# Patient Record
Sex: Female | Born: 1945 | Race: White | Hispanic: No | Marital: Single
Health system: Southern US, Community
[De-identification: ages and names within clinical notes are randomized; demographics above are authoritative.]

---

## 1998-05-06 ENCOUNTER — Other Ambulatory Visit: Admission: RE | Admit: 1998-05-06 | Discharge: 1998-05-06 | Payer: Self-pay | Admitting: Gynecology

## 1999-11-17 ENCOUNTER — Encounter: Payer: Self-pay | Admitting: Family Medicine

## 1999-11-17 ENCOUNTER — Encounter: Admission: RE | Admit: 1999-11-17 | Discharge: 1999-11-17 | Payer: Self-pay | Admitting: Family Medicine

## 2000-11-17 ENCOUNTER — Encounter: Admission: RE | Admit: 2000-11-17 | Discharge: 2000-11-17 | Payer: Self-pay | Admitting: Family Medicine

## 2000-11-17 ENCOUNTER — Encounter: Payer: Self-pay | Admitting: Family Medicine

## 2001-11-20 ENCOUNTER — Encounter: Payer: Self-pay | Admitting: Family Medicine

## 2001-11-20 ENCOUNTER — Encounter: Admission: RE | Admit: 2001-11-20 | Discharge: 2001-11-20 | Payer: Self-pay | Admitting: Family Medicine

## 2002-11-21 ENCOUNTER — Encounter: Admission: RE | Admit: 2002-11-21 | Discharge: 2002-11-21 | Payer: Self-pay | Admitting: Family Medicine

## 2002-11-21 ENCOUNTER — Encounter: Payer: Self-pay | Admitting: Family Medicine

## 2003-11-29 ENCOUNTER — Encounter: Admission: RE | Admit: 2003-11-29 | Discharge: 2003-11-29 | Payer: Self-pay | Admitting: Family Medicine

## 2004-09-24 ENCOUNTER — Other Ambulatory Visit: Admission: RE | Admit: 2004-09-24 | Discharge: 2004-09-24 | Payer: Self-pay | Admitting: Family Medicine

## 2004-12-07 ENCOUNTER — Encounter: Admission: RE | Admit: 2004-12-07 | Discharge: 2004-12-07 | Payer: Self-pay | Admitting: Family Medicine

## 2005-10-06 ENCOUNTER — Other Ambulatory Visit: Admission: RE | Admit: 2005-10-06 | Discharge: 2005-10-06 | Payer: Self-pay | Admitting: *Deleted

## 2005-12-22 ENCOUNTER — Encounter: Admission: RE | Admit: 2005-12-22 | Discharge: 2005-12-22 | Payer: Self-pay | Admitting: *Deleted

## 2006-01-07 ENCOUNTER — Encounter: Admission: RE | Admit: 2006-01-07 | Discharge: 2006-01-07 | Payer: Self-pay | Admitting: *Deleted

## 2006-10-12 ENCOUNTER — Other Ambulatory Visit: Admission: RE | Admit: 2006-10-12 | Discharge: 2006-10-12 | Payer: Self-pay | Admitting: *Deleted

## 2006-12-26 ENCOUNTER — Encounter: Admission: RE | Admit: 2006-12-26 | Discharge: 2006-12-26 | Payer: Self-pay | Admitting: *Deleted

## 2007-12-28 ENCOUNTER — Encounter: Admission: RE | Admit: 2007-12-28 | Discharge: 2007-12-28 | Payer: Self-pay | Admitting: Family Medicine

## 2008-09-25 ENCOUNTER — Other Ambulatory Visit: Admission: RE | Admit: 2008-09-25 | Discharge: 2008-09-25 | Payer: Self-pay | Admitting: Family Medicine

## 2008-12-30 ENCOUNTER — Encounter: Admission: RE | Admit: 2008-12-30 | Discharge: 2008-12-30 | Payer: Self-pay | Admitting: Family Medicine

## 2010-01-09 ENCOUNTER — Encounter: Admission: RE | Admit: 2010-01-09 | Discharge: 2010-01-09 | Payer: Self-pay | Admitting: Family Medicine

## 2010-11-02 ENCOUNTER — Other Ambulatory Visit: Admission: RE | Admit: 2010-11-02 | Discharge: 2010-11-02 | Payer: Self-pay | Admitting: Family Medicine

## 2011-01-11 ENCOUNTER — Encounter
Admission: RE | Admit: 2011-01-11 | Discharge: 2011-01-11 | Payer: Self-pay | Source: Home / Self Care | Attending: Family Medicine | Admitting: Family Medicine

## 2011-07-12 ENCOUNTER — Other Ambulatory Visit: Payer: Self-pay | Admitting: Physician Assistant

## 2011-07-12 ENCOUNTER — Ambulatory Visit
Admission: RE | Admit: 2011-07-12 | Discharge: 2011-07-12 | Disposition: A | Discharge status: Home / Self Care | Payer: BC Managed Care – PPO | Source: Ambulatory Visit | Attending: Physician Assistant | Admitting: Physician Assistant

## 2011-07-12 DIAGNOSIS — M546 Pain in thoracic spine: Secondary | ICD-10-CM

## 2012-01-05 ENCOUNTER — Other Ambulatory Visit: Payer: Self-pay | Admitting: Family Medicine

## 2012-01-05 DIAGNOSIS — Z1231 Encounter for screening mammogram for malignant neoplasm of breast: Secondary | ICD-10-CM

## 2012-01-18 ENCOUNTER — Ambulatory Visit
Admission: RE | Admit: 2012-01-18 | Discharge: 2012-01-18 | Disposition: A | Discharge status: Home / Self Care | Payer: BC Managed Care – PPO | Source: Ambulatory Visit | Attending: Family Medicine | Admitting: Family Medicine

## 2012-01-18 DIAGNOSIS — Z1231 Encounter for screening mammogram for malignant neoplasm of breast: Secondary | ICD-10-CM

## 2012-12-25 ENCOUNTER — Other Ambulatory Visit: Payer: Self-pay | Admitting: Family Medicine

## 2012-12-25 DIAGNOSIS — Z1231 Encounter for screening mammogram for malignant neoplasm of breast: Secondary | ICD-10-CM

## 2013-01-18 ENCOUNTER — Ambulatory Visit
Admission: RE | Admit: 2013-01-18 | Discharge: 2013-01-18 | Disposition: A | Discharge status: Home / Self Care | Payer: BC Managed Care – PPO | Source: Ambulatory Visit | Attending: Family Medicine | Admitting: Family Medicine

## 2013-01-18 DIAGNOSIS — Z1231 Encounter for screening mammogram for malignant neoplasm of breast: Secondary | ICD-10-CM

## 2013-07-06 ENCOUNTER — Other Ambulatory Visit (HOSPITAL_COMMUNITY)
Admission: RE | Admit: 2013-07-06 | Discharge: 2013-07-06 | Disposition: A | Discharge status: Home / Self Care | Payer: BC Managed Care – PPO | Source: Ambulatory Visit | Attending: Family Medicine | Admitting: Family Medicine

## 2013-07-06 ENCOUNTER — Other Ambulatory Visit: Payer: Self-pay | Admitting: Physician Assistant

## 2013-07-06 DIAGNOSIS — Z0000 Encounter for general adult medical examination without abnormal findings: Secondary | ICD-10-CM | POA: Insufficient documentation

## 2013-12-26 ENCOUNTER — Other Ambulatory Visit: Payer: Self-pay

## 2013-12-26 DIAGNOSIS — Z1231 Encounter for screening mammogram for malignant neoplasm of breast: Secondary | ICD-10-CM

## 2014-01-21 ENCOUNTER — Ambulatory Visit: Payer: BC Managed Care – PPO

## 2014-01-29 ENCOUNTER — Ambulatory Visit
Admission: RE | Admit: 2014-01-29 | Discharge: 2014-01-29 | Disposition: A | Discharge status: Home / Self Care | Payer: BC Managed Care – PPO | Source: Ambulatory Visit

## 2014-01-29 DIAGNOSIS — Z1231 Encounter for screening mammogram for malignant neoplasm of breast: Secondary | ICD-10-CM

## 2014-08-29 ENCOUNTER — Other Ambulatory Visit: Payer: Self-pay | Admitting: Dermatology

## 2014-10-07 ENCOUNTER — Other Ambulatory Visit: Payer: Self-pay | Admitting: Dermatology

## 2014-12-31 ENCOUNTER — Other Ambulatory Visit: Payer: Self-pay

## 2014-12-31 DIAGNOSIS — Z1231 Encounter for screening mammogram for malignant neoplasm of breast: Secondary | ICD-10-CM

## 2015-01-30 ENCOUNTER — Ambulatory Visit
Admission: RE | Admit: 2015-01-30 | Discharge: 2015-01-30 | Disposition: A | Discharge status: Home / Self Care | Payer: Medicare Other | Source: Ambulatory Visit

## 2015-01-30 ENCOUNTER — Encounter (INDEPENDENT_AMBULATORY_CARE_PROVIDER_SITE_OTHER): Payer: Self-pay

## 2015-01-30 DIAGNOSIS — Z1231 Encounter for screening mammogram for malignant neoplasm of breast: Secondary | ICD-10-CM

## 2016-01-02 ENCOUNTER — Other Ambulatory Visit: Payer: Self-pay

## 2016-01-02 DIAGNOSIS — Z1231 Encounter for screening mammogram for malignant neoplasm of breast: Secondary | ICD-10-CM

## 2016-02-02 ENCOUNTER — Ambulatory Visit
Admission: RE | Admit: 2016-02-02 | Discharge: 2016-02-02 | Disposition: A | Discharge status: Home / Self Care | Payer: Medicare Other | Source: Ambulatory Visit

## 2016-02-02 DIAGNOSIS — Z1231 Encounter for screening mammogram for malignant neoplasm of breast: Secondary | ICD-10-CM

## 2017-01-11 ENCOUNTER — Other Ambulatory Visit: Payer: Self-pay | Admitting: Family Medicine

## 2017-01-11 DIAGNOSIS — Z1231 Encounter for screening mammogram for malignant neoplasm of breast: Secondary | ICD-10-CM

## 2017-02-02 ENCOUNTER — Ambulatory Visit
Admission: RE | Admit: 2017-02-02 | Discharge: 2017-02-02 | Disposition: A | Discharge status: Home / Self Care | Payer: Self-pay | Source: Ambulatory Visit | Attending: Family Medicine | Admitting: Family Medicine

## 2017-02-02 ENCOUNTER — Encounter: Payer: Self-pay | Admitting: Radiology

## 2017-02-02 DIAGNOSIS — Z1231 Encounter for screening mammogram for malignant neoplasm of breast: Secondary | ICD-10-CM

## 2017-04-18 DIAGNOSIS — F17211 Nicotine dependence, cigarettes, in remission: Secondary | ICD-10-CM | POA: Diagnosis not present

## 2017-04-18 DIAGNOSIS — I10 Essential (primary) hypertension: Secondary | ICD-10-CM | POA: Diagnosis not present

## 2017-04-18 DIAGNOSIS — E782 Mixed hyperlipidemia: Secondary | ICD-10-CM | POA: Diagnosis not present

## 2017-09-21 DIAGNOSIS — T782XXA Anaphylactic shock, unspecified, initial encounter: Secondary | ICD-10-CM | POA: Diagnosis not present

## 2017-10-19 DIAGNOSIS — E782 Mixed hyperlipidemia: Secondary | ICD-10-CM | POA: Diagnosis not present

## 2017-10-19 DIAGNOSIS — G43109 Migraine with aura, not intractable, without status migrainosus: Secondary | ICD-10-CM | POA: Diagnosis not present

## 2017-10-19 DIAGNOSIS — Z1389 Encounter for screening for other disorder: Secondary | ICD-10-CM | POA: Diagnosis not present

## 2017-10-19 DIAGNOSIS — Z1211 Encounter for screening for malignant neoplasm of colon: Secondary | ICD-10-CM | POA: Diagnosis not present

## 2017-10-19 DIAGNOSIS — Z6835 Body mass index (BMI) 35.0-35.9, adult: Secondary | ICD-10-CM | POA: Diagnosis not present

## 2017-10-19 DIAGNOSIS — Z0000 Encounter for general adult medical examination without abnormal findings: Secondary | ICD-10-CM | POA: Diagnosis not present

## 2017-10-19 DIAGNOSIS — I10 Essential (primary) hypertension: Secondary | ICD-10-CM | POA: Diagnosis not present

## 2017-11-21 DIAGNOSIS — Z8371 Family history of colonic polyps: Secondary | ICD-10-CM | POA: Diagnosis not present

## 2017-11-21 DIAGNOSIS — K635 Polyp of colon: Secondary | ICD-10-CM | POA: Diagnosis not present

## 2017-11-21 DIAGNOSIS — Z1211 Encounter for screening for malignant neoplasm of colon: Secondary | ICD-10-CM | POA: Diagnosis not present

## 2017-11-21 DIAGNOSIS — K621 Rectal polyp: Secondary | ICD-10-CM | POA: Diagnosis not present

## 2017-11-24 DIAGNOSIS — K635 Polyp of colon: Secondary | ICD-10-CM | POA: Diagnosis not present

## 2018-01-23 ENCOUNTER — Other Ambulatory Visit: Payer: Self-pay | Admitting: Family Medicine

## 2018-01-23 DIAGNOSIS — Z1231 Encounter for screening mammogram for malignant neoplasm of breast: Secondary | ICD-10-CM

## 2018-02-08 ENCOUNTER — Ambulatory Visit: Payer: PPO

## 2018-02-24 ENCOUNTER — Ambulatory Visit
Admission: RE | Admit: 2018-02-24 | Discharge: 2018-02-24 | Disposition: A | Discharge status: Home / Self Care | Payer: PPO | Source: Ambulatory Visit | Attending: Family Medicine | Admitting: Family Medicine

## 2018-02-24 DIAGNOSIS — Z1231 Encounter for screening mammogram for malignant neoplasm of breast: Secondary | ICD-10-CM | POA: Diagnosis not present

## 2018-03-06 DIAGNOSIS — H524 Presbyopia: Secondary | ICD-10-CM | POA: Diagnosis not present

## 2018-03-06 DIAGNOSIS — H52223 Regular astigmatism, bilateral: Secondary | ICD-10-CM | POA: Diagnosis not present

## 2018-03-06 DIAGNOSIS — H43812 Vitreous degeneration, left eye: Secondary | ICD-10-CM | POA: Diagnosis not present

## 2018-03-06 DIAGNOSIS — H2513 Age-related nuclear cataract, bilateral: Secondary | ICD-10-CM | POA: Diagnosis not present

## 2018-04-17 DIAGNOSIS — E782 Mixed hyperlipidemia: Secondary | ICD-10-CM | POA: Diagnosis not present

## 2018-04-17 DIAGNOSIS — Z6833 Body mass index (BMI) 33.0-33.9, adult: Secondary | ICD-10-CM | POA: Diagnosis not present

## 2018-04-17 DIAGNOSIS — I10 Essential (primary) hypertension: Secondary | ICD-10-CM | POA: Diagnosis not present

## 2018-04-17 DIAGNOSIS — G43109 Migraine with aura, not intractable, without status migrainosus: Secondary | ICD-10-CM | POA: Diagnosis not present

## 2018-06-19 DIAGNOSIS — R944 Abnormal results of kidney function studies: Secondary | ICD-10-CM | POA: Diagnosis not present

## 2018-10-25 DIAGNOSIS — E669 Obesity, unspecified: Secondary | ICD-10-CM | POA: Diagnosis not present

## 2018-10-25 DIAGNOSIS — G43109 Migraine with aura, not intractable, without status migrainosus: Secondary | ICD-10-CM | POA: Diagnosis not present

## 2018-10-25 DIAGNOSIS — E782 Mixed hyperlipidemia: Secondary | ICD-10-CM | POA: Diagnosis not present

## 2018-10-25 DIAGNOSIS — L989 Disorder of the skin and subcutaneous tissue, unspecified: Secondary | ICD-10-CM | POA: Diagnosis not present

## 2018-10-25 DIAGNOSIS — Z6833 Body mass index (BMI) 33.0-33.9, adult: Secondary | ICD-10-CM | POA: Diagnosis not present

## 2018-10-25 DIAGNOSIS — Z0000 Encounter for general adult medical examination without abnormal findings: Secondary | ICD-10-CM | POA: Diagnosis not present

## 2018-10-25 DIAGNOSIS — I10 Essential (primary) hypertension: Secondary | ICD-10-CM | POA: Diagnosis not present

## 2018-10-25 DIAGNOSIS — Z1389 Encounter for screening for other disorder: Secondary | ICD-10-CM | POA: Diagnosis not present

## 2018-10-25 DIAGNOSIS — M8580 Other specified disorders of bone density and structure, unspecified site: Secondary | ICD-10-CM | POA: Diagnosis not present

## 2018-11-24 DIAGNOSIS — D582 Other hemoglobinopathies: Secondary | ICD-10-CM | POA: Diagnosis not present

## 2019-01-16 ENCOUNTER — Other Ambulatory Visit: Payer: Self-pay | Admitting: Family Medicine

## 2019-01-16 DIAGNOSIS — Z1231 Encounter for screening mammogram for malignant neoplasm of breast: Secondary | ICD-10-CM

## 2019-02-27 ENCOUNTER — Ambulatory Visit
Admission: RE | Admit: 2019-02-27 | Discharge: 2019-02-27 | Disposition: A | Discharge status: Home / Self Care | Payer: PPO | Source: Ambulatory Visit | Attending: Family Medicine | Admitting: Family Medicine

## 2019-02-27 DIAGNOSIS — Z1231 Encounter for screening mammogram for malignant neoplasm of breast: Secondary | ICD-10-CM | POA: Diagnosis not present

## 2019-04-25 DIAGNOSIS — I10 Essential (primary) hypertension: Secondary | ICD-10-CM | POA: Diagnosis not present

## 2019-04-25 DIAGNOSIS — E782 Mixed hyperlipidemia: Secondary | ICD-10-CM | POA: Diagnosis not present

## 2019-11-05 DIAGNOSIS — E782 Mixed hyperlipidemia: Secondary | ICD-10-CM | POA: Diagnosis not present

## 2019-11-05 DIAGNOSIS — Z1389 Encounter for screening for other disorder: Secondary | ICD-10-CM | POA: Diagnosis not present

## 2019-11-05 DIAGNOSIS — I10 Essential (primary) hypertension: Secondary | ICD-10-CM | POA: Diagnosis not present

## 2019-11-05 DIAGNOSIS — Z0000 Encounter for general adult medical examination without abnormal findings: Secondary | ICD-10-CM | POA: Diagnosis not present

## 2019-11-07 IMAGING — MG DIGITAL SCREENING BILATERAL MAMMOGRAM WITH TOMO AND CAD
8 series · 9 of 24 positions shown · non-contrast
Comparison: Previous exam(s).

CLINICAL DATA: Screening.

EXAM:
DIGITAL SCREENING BILATERAL MAMMOGRAM WITH TOMO AND CAD

[R MLO synth-2D]
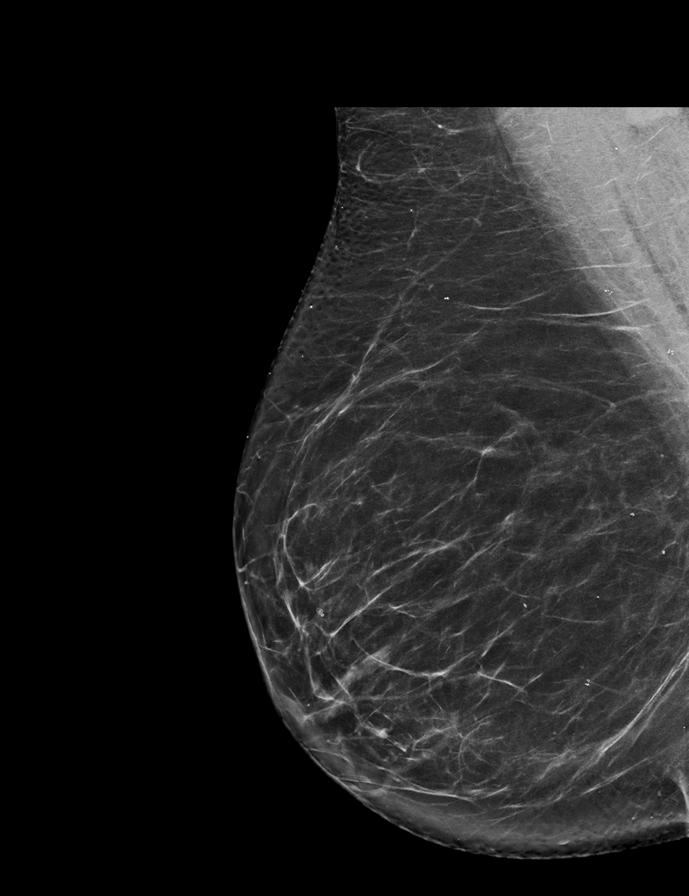

[L MLO synth-2D]
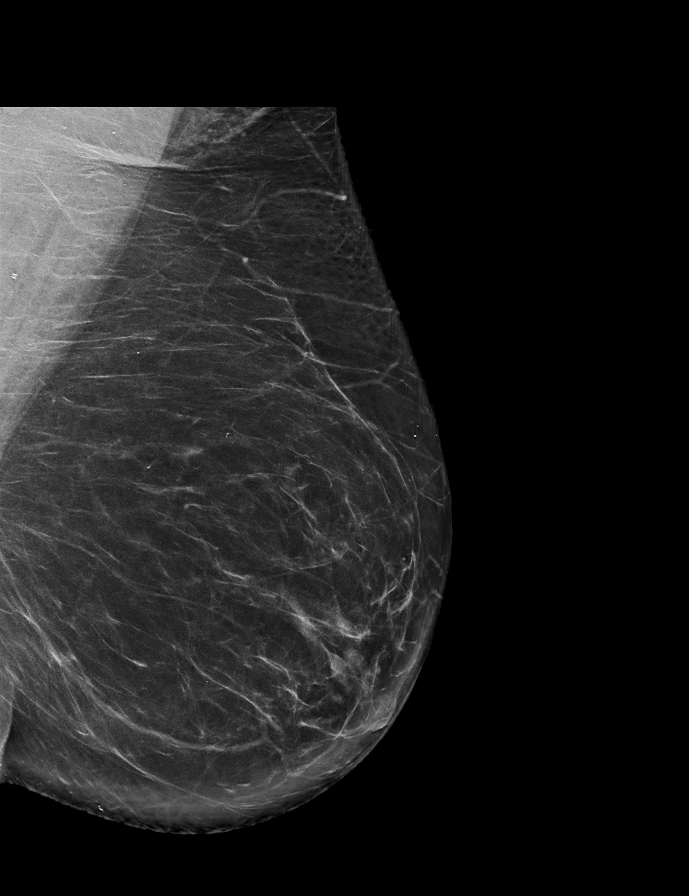

[R CC synth-2D]
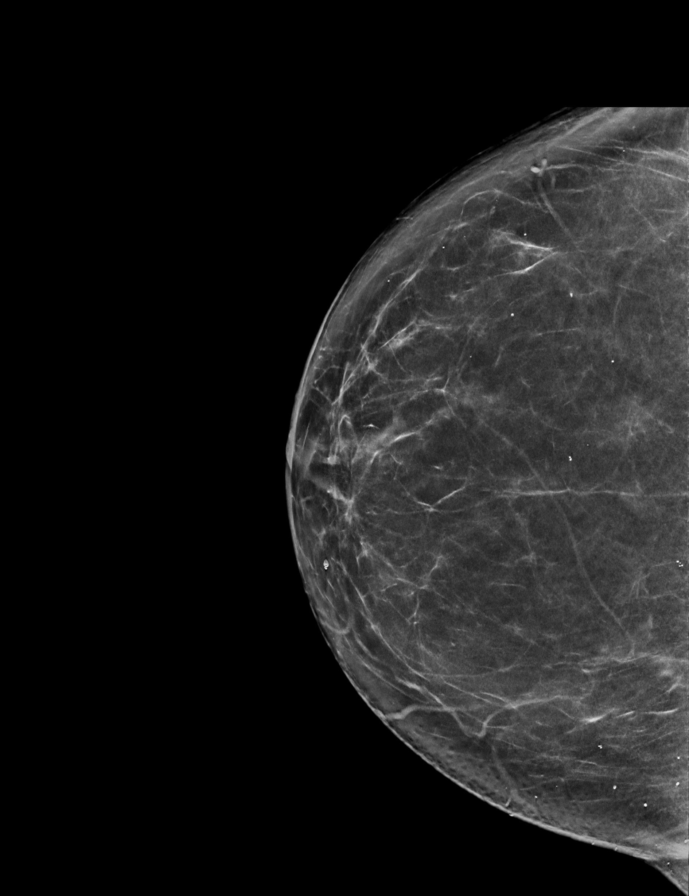

[L CC synth-2D]
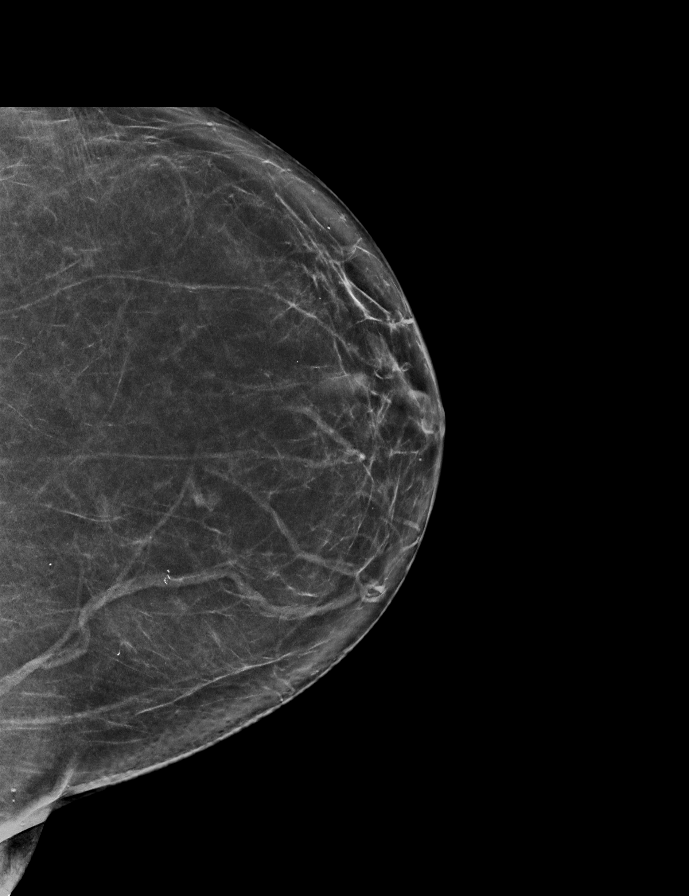

[L CC tomo · 2 of 76 frames shown]
[frame 25/76]
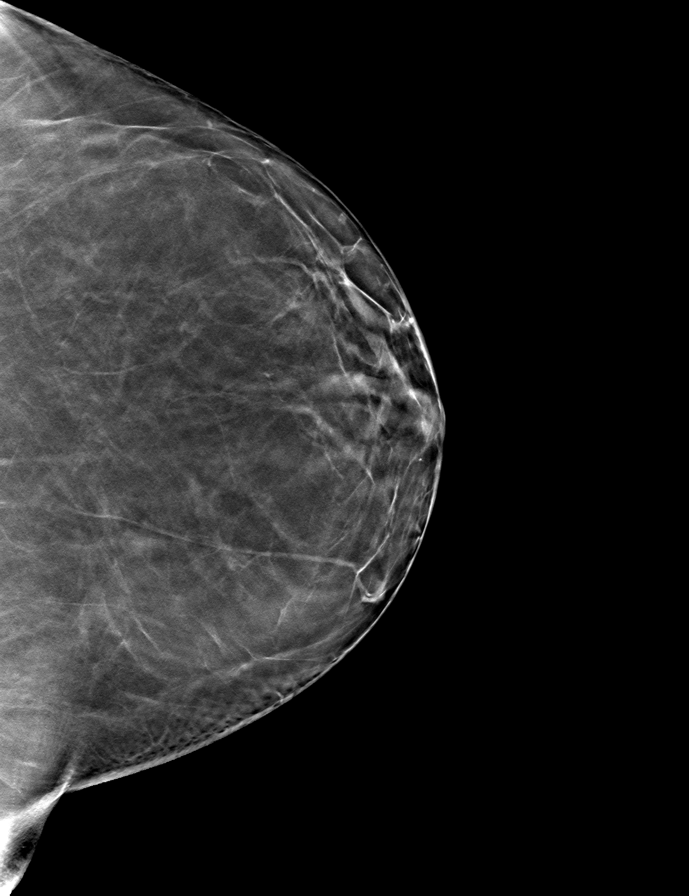
[frame 39/76]
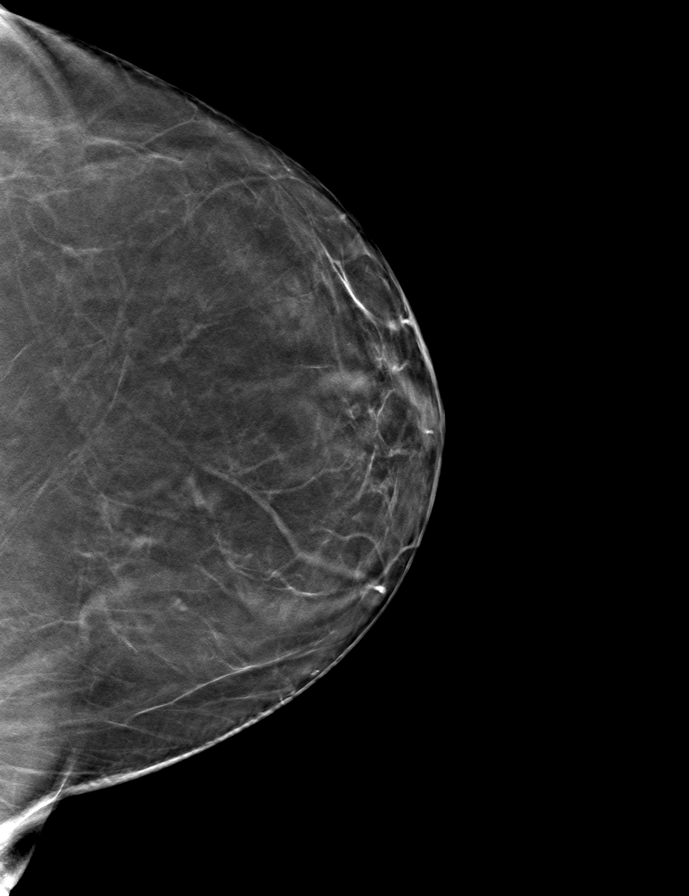

[L MLO tomo · tomo slice 43/86.0]
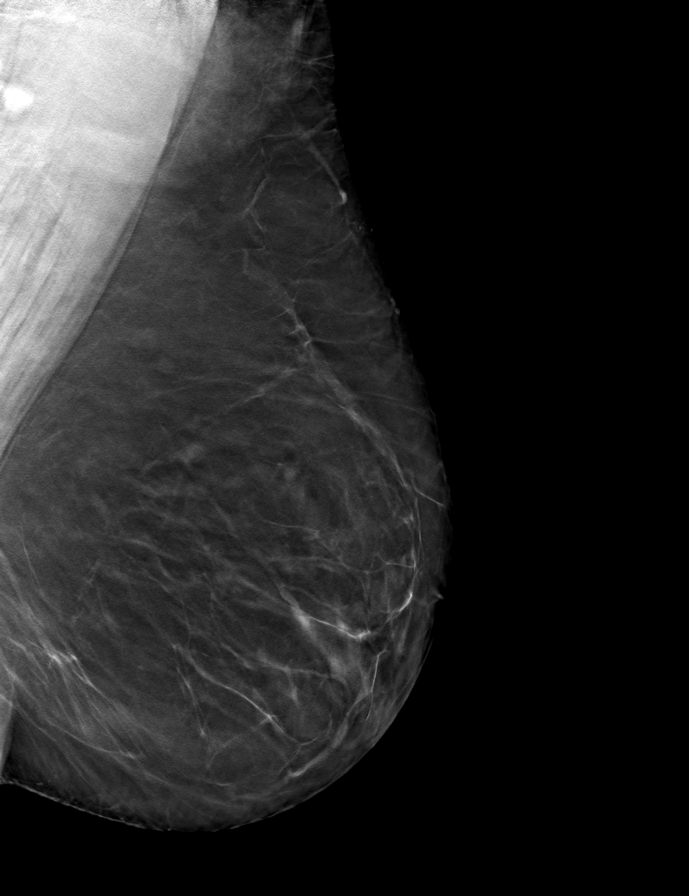

[R MLO tomo · tomo slice 41/80.0]
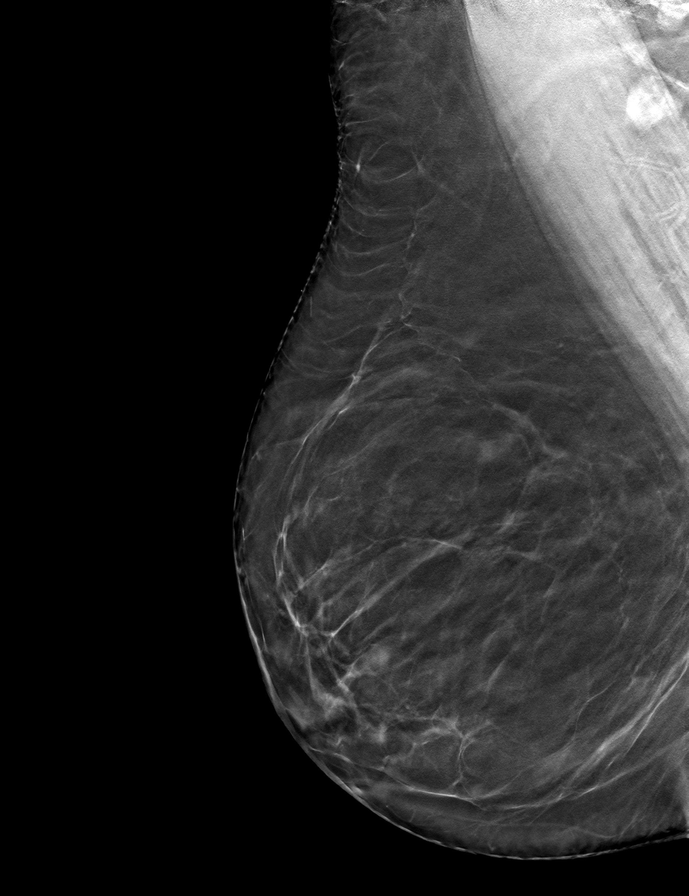

[R CC tomo · tomo slice 39/76.0]
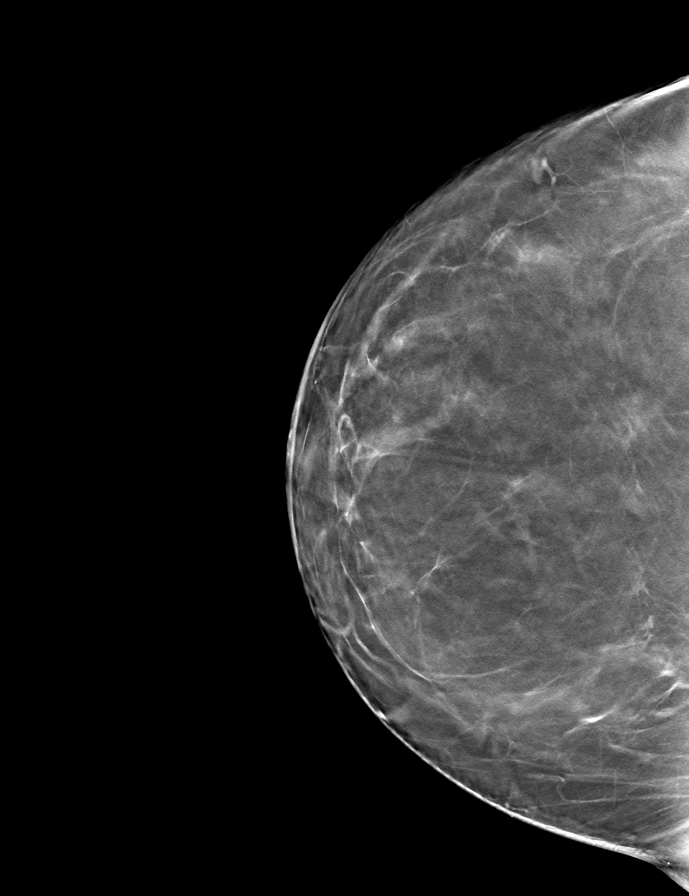

[9 of 24 positions shown; findings below may reference images not displayed]

ACR Breast Density Category b: There are scattered areas of
fibroglandular density.
FINDINGS: There are no findings suspicious for malignancy. Images were
processed with CAD.
IMPRESSION: No mammographic evidence of malignancy. A result letter of this
screening mammogram will be mailed directly to the patient.

RECOMMENDATION:
Screening mammogram in one year. (Code:[id number])

BI-RADS CATEGORY  1: Negative.

## 2019-11-20 DIAGNOSIS — E782 Mixed hyperlipidemia: Secondary | ICD-10-CM | POA: Diagnosis not present

## 2020-01-16 ENCOUNTER — Ambulatory Visit: Payer: PPO

## 2020-01-21 ENCOUNTER — Other Ambulatory Visit: Payer: Self-pay | Admitting: Family Medicine

## 2020-01-21 DIAGNOSIS — Z1231 Encounter for screening mammogram for malignant neoplasm of breast: Secondary | ICD-10-CM

## 2020-01-25 ENCOUNTER — Ambulatory Visit: Payer: PPO | Attending: Internal Medicine

## 2020-01-25 DIAGNOSIS — Z23 Encounter for immunization: Secondary | ICD-10-CM | POA: Insufficient documentation

## 2020-01-25 NOTE — Progress Notes (Signed)
   Covid-19 Vaccination Clinic  Name:  Angela Dickson    MRN: [id number] DOB: 1946/09/21  01/25/2020  [name] was observed post Covid-19 immunization for 15 minutes without incidence. She was provided with Vaccine Information Sheet and instruction to access the V-Safe system.   [name] was instructed to call 911 with any severe reactions post vaccine: Marland Kitchen Difficulty breathing  . Swelling of your face and throat  . A fast heartbeat  . A bad rash all over your body  . Dizziness and weakness    Immunizations Administered    Name Date Dose VIS Date Route   Pfizer COVID-19 Vaccine 01/25/2020  9:04 AM 0.3 mL 11/30/2019 Intramuscular   Manufacturer: [employer], [employer]   Lot: [id number]   NDC: [address]-4

## 2020-02-06 ENCOUNTER — Ambulatory Visit: Payer: PPO

## 2020-02-20 ENCOUNTER — Ambulatory Visit: Payer: PPO | Attending: Internal Medicine

## 2020-02-20 ENCOUNTER — Ambulatory Visit: Payer: PPO

## 2020-02-20 DIAGNOSIS — Z23 Encounter for immunization: Secondary | ICD-10-CM

## 2020-02-20 NOTE — Progress Notes (Signed)
   Covid-19 Vaccination Clinic  Name:  RENEE ERB    MRN: [id number] DOB: 1946/01/24  02/20/2020  Ms. Marcano was observed post Covid-19 immunization for 15 minutes without incident. She was provided with Vaccine Information Sheet and instruction to access the V-Safe system.   Ms. Suen was instructed to call 911 with any severe reactions post vaccine: Marland Kitchen Difficulty breathing  . Swelling of face and throat  . A fast heartbeat  . A bad rash all over body  . Dizziness and weakness   Immunizations Administered    Name Date Dose VIS Date Route   Pfizer COVID-19 Vaccine 02/20/2020  9:56 AM 0.3 mL 11/30/2019 Intramuscular   Manufacturer: [employer], [employer]   Lot: [id number]   NDC: [address]-1

## 2020-02-28 ENCOUNTER — Ambulatory Visit: Payer: PPO

## 2020-04-02 ENCOUNTER — Other Ambulatory Visit: Payer: Self-pay

## 2020-04-02 ENCOUNTER — Ambulatory Visit
Admission: RE | Admit: 2020-04-02 | Discharge: 2020-04-02 | Disposition: A | Discharge status: Home / Self Care | Payer: PPO | Source: Ambulatory Visit | Attending: Family Medicine | Admitting: Family Medicine

## 2020-04-02 DIAGNOSIS — Z1231 Encounter for screening mammogram for malignant neoplasm of breast: Secondary | ICD-10-CM | POA: Diagnosis not present

## 2020-05-06 DIAGNOSIS — E782 Mixed hyperlipidemia: Secondary | ICD-10-CM | POA: Diagnosis not present

## 2020-05-06 DIAGNOSIS — E669 Obesity, unspecified: Secondary | ICD-10-CM | POA: Diagnosis not present

## 2020-05-06 DIAGNOSIS — F17211 Nicotine dependence, cigarettes, in remission: Secondary | ICD-10-CM | POA: Diagnosis not present

## 2020-05-06 DIAGNOSIS — I10 Essential (primary) hypertension: Secondary | ICD-10-CM | POA: Diagnosis not present

## 2020-05-06 DIAGNOSIS — D582 Other hemoglobinopathies: Secondary | ICD-10-CM | POA: Diagnosis not present

## 2020-06-12 DIAGNOSIS — D582 Other hemoglobinopathies: Secondary | ICD-10-CM | POA: Diagnosis not present

## 2020-06-12 DIAGNOSIS — I10 Essential (primary) hypertension: Secondary | ICD-10-CM | POA: Diagnosis not present

## 2020-10-28 DIAGNOSIS — H524 Presbyopia: Secondary | ICD-10-CM | POA: Diagnosis not present

## 2020-10-28 DIAGNOSIS — H2513 Age-related nuclear cataract, bilateral: Secondary | ICD-10-CM | POA: Diagnosis not present

## 2020-10-28 DIAGNOSIS — H52223 Regular astigmatism, bilateral: Secondary | ICD-10-CM | POA: Diagnosis not present

## 2020-11-06 DIAGNOSIS — E669 Obesity, unspecified: Secondary | ICD-10-CM | POA: Diagnosis not present

## 2020-11-06 DIAGNOSIS — E782 Mixed hyperlipidemia: Secondary | ICD-10-CM | POA: Diagnosis not present

## 2020-11-06 DIAGNOSIS — F17200 Nicotine dependence, unspecified, uncomplicated: Secondary | ICD-10-CM | POA: Diagnosis not present

## 2020-11-06 DIAGNOSIS — Z1389 Encounter for screening for other disorder: Secondary | ICD-10-CM | POA: Diagnosis not present

## 2020-11-06 DIAGNOSIS — I10 Essential (primary) hypertension: Secondary | ICD-10-CM | POA: Diagnosis not present

## 2020-11-06 DIAGNOSIS — M8580 Other specified disorders of bone density and structure, unspecified site: Secondary | ICD-10-CM | POA: Diagnosis not present

## 2020-11-06 DIAGNOSIS — Z0000 Encounter for general adult medical examination without abnormal findings: Secondary | ICD-10-CM | POA: Diagnosis not present

## 2021-02-19 ENCOUNTER — Other Ambulatory Visit: Payer: Self-pay | Admitting: Family Medicine

## 2021-02-19 DIAGNOSIS — Z1231 Encounter for screening mammogram for malignant neoplasm of breast: Secondary | ICD-10-CM

## 2021-04-13 ENCOUNTER — Other Ambulatory Visit: Payer: Self-pay

## 2021-04-13 ENCOUNTER — Ambulatory Visit
Admission: RE | Admit: 2021-04-13 | Discharge: 2021-04-13 | Disposition: A | Discharge status: Home / Self Care | Payer: PPO | Source: Ambulatory Visit | Attending: Family Medicine | Admitting: Family Medicine

## 2021-04-13 DIAGNOSIS — Z1231 Encounter for screening mammogram for malignant neoplasm of breast: Secondary | ICD-10-CM

## 2021-04-30 DIAGNOSIS — L900 Lichen sclerosus et atrophicus: Secondary | ICD-10-CM | POA: Diagnosis not present

## 2021-05-06 DIAGNOSIS — I10 Essential (primary) hypertension: Secondary | ICD-10-CM | POA: Diagnosis not present

## 2021-05-06 DIAGNOSIS — F17211 Nicotine dependence, cigarettes, in remission: Secondary | ICD-10-CM | POA: Diagnosis not present

## 2021-05-06 DIAGNOSIS — E782 Mixed hyperlipidemia: Secondary | ICD-10-CM | POA: Diagnosis not present

## 2021-10-26 ENCOUNTER — Ambulatory Visit: Payer: PPO | Admitting: Dermatology

## 2021-10-26 ENCOUNTER — Other Ambulatory Visit: Payer: Self-pay

## 2021-10-26 DIAGNOSIS — L900 Lichen sclerosus et atrophicus: Secondary | ICD-10-CM | POA: Diagnosis not present

## 2021-10-26 MED ORDER — TACROLIMUS 0.1 % EX OINT: TOPICAL_OINTMENT | Freq: Every day | CUTANEOUS | 0 refills | Status: DC

## 2021-10-26 MED ORDER — CLOBETASOL PROPIONATE 0.05 % EX OINT: 1.0000 "application " | TOPICAL_OINTMENT | Freq: Every day | CUTANEOUS | 0 refills | Status: DC

## 2021-10-26 NOTE — Progress Notes (Signed)
   New Patient Visit  Subjective  Arline Asp Apple Thal is a 75 y.o. 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 Danella Deis). She has been treating it with Clobetasol ointment and it keeps it stable/).  The following portions of the chart were reviewed this encounter and updated as appropriate:   Allergies  Meds  Problems  Med Hx  Surg Hx  Fam Hx     Review of Systems:  No other skin or systemic complaints except as noted in HPI or Assessment and Plan.  Objective  Well appearing patient in no apparent distress; mood and affect are within normal limits.  A focused examination was performed including left hip. Relevant physical exam findings are noted in the Assessment and Plan.  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  Not to goal.  Discussed condition and treatment options.  Continue Clobetasol qd Start Tacrolimus 0.1% cream qd  Opzelura cream sample given - advised patient to apply Opzelura to 1/3 of area and Clobetasol and Tacrolimus to the other 2/3 of area. Recheck on follow up.  tacrolimus (PROTOPIC) 0.1 % ointment - Left Hip Apply topically daily.  clobetasol ointment (TEMOVATE) 0.05 % - Left Hip Apply 1 application topically daily.  Return for 2-3 months , Follow up.  I, Joanie Coddington, CMA, am acting as scribe for Armida Sans, MD . Documentation: I have reviewed the above documentation for accuracy and completeness, and I agree with the above.  Armida Sans, MD

## 2021-10-26 NOTE — Patient Instructions (Signed)
Apply Clobetasol ointment once daily and Tacrolimus  oint once daily to 2/3 of area on left hip. On the other 1/3 of the area apply Opzelura cream.  If you have any questions or concerns for your doctor, please call our main line at [phone] and press option 4 to reach your doctor's medical assistant. If no one answers, please leave a voicemail as directed and we will return your call as soon as possible. Messages left after 4 pm will be answered the following business day.   You may also send [place] a message via MyChart. We typically respond to MyChart messages within 1-2 business days.  For prescription refills, please ask your pharmacy to contact our office. Our fax number is [phone].  If you have an urgent issue when the clinic is closed that cannot wait until the next business day, you can page your doctor at the number below.    Please note that while we do our best to be available for urgent issues outside of office hours, we are not available 24/7.   If you have an urgent issue and are unable to reach [place], you may choose to seek medical care at your doctor's office, retail clinic, urgent care center, or emergency room.  If you have a medical emergency, please immediately call 911 or go to the emergency department.  Pager Numbers  - Dr. Gwen Pounds: [phone]  - Dr. Neale Burly: [phone]  - Dr. Roseanne Reno: [phone]  In the event of inclement weather, please call our main line at [phone] for an update on the status of any delays or closures.  Dermatology Medication Tips: Please keep the boxes that topical medications come in in order to help keep track of the instructions about where and how to use these. Pharmacies typically print the medication instructions only on the boxes and not directly on the medication tubes.   If your medication is too expensive, please contact our office at [phone] option 4 or send [place] a message through MyChart.   We are unable to tell what  your co-pay for medications will be in advance as this is different depending on your insurance coverage. However, we may be able to find a substitute medication at lower cost or fill out paperwork to get insurance to cover a needed medication.   If a prior authorization is required to get your medication covered by your insurance company, please allow [place] 1-2 business days to complete this process.  Drug prices often vary depending on where the prescription is filled and some pharmacies may offer cheaper prices.  The website www.goodrx.com contains coupons for medications through different pharmacies. The prices here do not account for what the cost may be with help from insurance (it may be cheaper with your insurance), but the website can give you the price if you did not use any insurance.  - You can print the associated coupon and take it with your prescription to the pharmacy.  - You may also stop by our office during regular business hours and pick up a GoodRx coupon card.  - If you need your prescription sent electronically to a different pharmacy, notify our office through [facility] or by phone at [phone] option 4.

## 2021-10-27 ENCOUNTER — Encounter: Payer: Self-pay | Admitting: Dermatology

## 2021-11-23 DIAGNOSIS — Z0000 Encounter for general adult medical examination without abnormal findings: Secondary | ICD-10-CM | POA: Diagnosis not present

## 2021-11-23 DIAGNOSIS — E669 Obesity, unspecified: Secondary | ICD-10-CM | POA: Diagnosis not present

## 2021-11-23 DIAGNOSIS — I10 Essential (primary) hypertension: Secondary | ICD-10-CM | POA: Diagnosis not present

## 2021-11-23 DIAGNOSIS — E782 Mixed hyperlipidemia: Secondary | ICD-10-CM | POA: Diagnosis not present

## 2021-11-23 DIAGNOSIS — Z1389 Encounter for screening for other disorder: Secondary | ICD-10-CM | POA: Diagnosis not present

## 2021-11-24 ENCOUNTER — Other Ambulatory Visit: Payer: Self-pay | Admitting: Dermatology

## 2021-11-24 DIAGNOSIS — L900 Lichen sclerosus et atrophicus: Secondary | ICD-10-CM

## 2022-01-04 ENCOUNTER — Ambulatory Visit: Payer: PPO | Admitting: Dermatology

## 2022-01-04 ENCOUNTER — Other Ambulatory Visit: Payer: Self-pay

## 2022-01-04 DIAGNOSIS — L900 Lichen sclerosus et atrophicus: Secondary | ICD-10-CM

## 2022-01-04 MED ORDER — TACROLIMUS 0.1 % EX OINT: TOPICAL_OINTMENT | CUTANEOUS | 6 refills | Status: DC

## 2022-01-04 MED ORDER — OPZELURA 1.5 % EX CREA: 1.0000 "application " | TOPICAL_CREAM | CUTANEOUS | 6 refills | Status: DC

## 2022-01-04 MED ORDER — CLOBETASOL PROPIONATE 0.05 % EX OINT: 1.0000 "application " | TOPICAL_OINTMENT | CUTANEOUS | 6 refills | Status: DC

## 2022-01-04 NOTE — Progress Notes (Signed)
° °  Follow-Up Visit   Subjective  Angela Dickson is a 76 y.o. female who presents for the following: Follow-up (Patient here today for follow up on lichen sclerosus et atrophicus at left hip. Patient has been using tacrolimus ointment, clobetasol cream and opzelura cream. She reports area has improved. ).  The following portions of the chart were reviewed this encounter and updated as appropriate:  Allergies   Meds   Problems   Med Hx   Surg Hx   Fam Hx      Review of Systems: No other skin or systemic complaints except as noted in HPI or Assessment and Plan.  Objective  Well appearing patient in no apparent distress; mood and affect are within normal limits.  A focused examination was performed including left hip. Relevant physical exam findings are noted in the Assessment and Plan.  Left Hip 13.0 x 6.0 cm pink patch   Assessment & Plan  Lichen sclerosus et atrophicus Left Hip  Chronic and persistent.  Not to goal.  Continue Clobetasol ointment apply topically to affected area mornings on M-W-F weekly. Avoid F/G/A Continue Tacrolimus 0.1% cream apply topically to affected area mornings on T-Thu-Sat weekly.  Start Opzelura cream - apply topically to affected area nightly    Topical steroids (such as triamcinolone, fluocinolone, fluocinonide, mometasone, clobetasol, halobetasol, betamethasone, hydrocortisone) can cause thinning and lightening of the skin if they are used for too long in the same area. Your physician has selected the right strength medicine for your problem and area affected on the body. Please use your medication only as directed by your physician to prevent side effects.     tacrolimus (PROTOPIC) 0.1 % ointment - Left Hip APPLY TO AFFECTED AREA TOPICALLY mornings on Tuesday, thursdays, and saturdays weekly  clobetasol ointment (TEMOVATE) 0.05 % - Left Hip Apply 1 application topically See admin instructions. Use every morning at affected area of left hip on  mondays,wednesdays, and fridays weekly. Avoid applying to face, groin, and axilla. Use as directed.  Ruxolitinib Phosphate (OPZELURA) 1.5 % CREA - Left Hip Apply 1 application topically See admin instructions. Apply to affected area of left hip nightly   Return for 6 month follow up on LS et A.  I, Asher Muir, CMA, am acting as scribe for Armida Sans, MD.. Documentation: I have reviewed the above documentation for accuracy and completeness, and I agree with the above.  Armida Sans, MD

## 2022-01-04 NOTE — Patient Instructions (Addendum)
° °Topical steroids (such as triamcinolone, fluocinolone, fluocinonide, mometasone, clobetasol, halobetasol, betamethasone, hydrocortisone) can cause thinning and lightening of the skin if they are used for too long in the same area. Your physician has selected the right strength medicine for your problem and area affected on the body. Please use your medication only as directed by your physician to prevent side effects.  ° ° °If You Need Anything After Your Visit ° °If you have any questions or concerns for your doctor, please call our main line at [phone] and press option 4 to reach your doctor's medical assistant. If no one answers, please leave a voicemail as directed and we will return your call as soon as possible. Messages left after 4 pm will be answered the following business day.  ° °You may also send us a message via MyChart. We typically respond to MyChart messages within 1-2 business days. ° °For prescription refills, please ask your pharmacy to contact our office. Our fax number is [phone]. ° °If you have an urgent issue when the clinic is closed that cannot wait until the next business day, you can page your doctor at the number below.   ° °Please note that while we do our best to be available for urgent issues outside of office hours, we are not available 24/7.  ° °If you have an urgent issue and are unable to reach us, you may choose to seek medical care at your doctor's office, retail clinic, urgent care center, or emergency room. ° °If you have a medical emergency, please immediately call 911 or go to the emergency department. ° °Pager Numbers ° °- Dr. Kowalski: [phone] ° °- Dr. Moye: [phone] ° °- Dr. Stewart: [phone] ° °In the event of inclement weather, please call our main line at [phone] for an update on the status of any delays or closures. ° °Dermatology Medication Tips: °Please keep the boxes that topical medications come in in order to help keep track of the  instructions about where and how to use these. Pharmacies typically print the medication instructions only on the boxes and not directly on the medication tubes.  ° °If your medication is too expensive, please contact our office at [phone] option 4 or send us a message through MyChart.  ° °We are unable to tell what your co-pay for medications will be in advance as this is different depending on your insurance coverage. However, we may be able to find a substitute medication at lower cost or fill out paperwork to get insurance to cover a needed medication.  ° °If a prior authorization is required to get your medication covered by your insurance company, please allow us 1-2 business days to complete this process. ° °Drug prices often vary depending on where the prescription is filled and some pharmacies may offer cheaper prices. ° °The website www.goodrx.com contains coupons for medications through different pharmacies. The prices here do not account for what the cost may be with help from insurance (it may be cheaper with your insurance), but the website can give you the price if you did not use any insurance.  °- You can print the associated coupon and take it with your prescription to the pharmacy.  °- You may also stop by our office during regular business hours and pick up a GoodRx coupon card.  °- If you need your prescription sent electronically to a different pharmacy, notify our office through [facility] MyChart or by phone at [phone] option 4. ° ° ° ° °  Si Usted Necesita Algo Después de Su Visita ° °También puede enviarnos un mensaje a través de MyChart. Por lo general respondemos a los mensajes de MyChart en el transcurso de 1 a 2 días hábiles. ° °Para renovar recetas, por favor pida a su farmacia que se ponga en contacto con nuestra oficina. Nuestro número de fax es el [phone]. ° °Si tiene un asunto urgente cuando la clínica esté cerrada y que no puede esperar hasta el siguiente día hábil,  puede llamar/localizar a su doctor(a) al número que aparece a continuación.  ° °Por favor, tenga en cuenta que aunque hacemos todo lo posible para estar disponibles para asuntos urgentes fuera del horario de oficina, no estamos disponibles las 24 horas del día, los 7 días de la semana.  ° °Si tiene un problema urgente y no puede comunicarse con nosotros, puede optar por buscar atención médica  en el consultorio de su doctor(a), en una clínica privada, en un centro de atención urgente o en una sala de emergencias. ° °Si tiene una emergencia médica, por favor llame inmediatamente al 911 o vaya a la sala de emergencias. ° °Números de bíper ° °- Dr. Kowalski: [phone] ° °- Dra. Moye: [phone] ° °- Dra. Stewart: [phone] ° °En caso de inclemencias del tiempo, por favor llame a nuestra línea principal al [phone] para una actualización sobre el estado de cualquier retraso o cierre. ° °Consejos para la medicación en dermatología: °Por favor, guarde las cajas en las que vienen los medicamentos de uso tópico para ayudarle a seguir las instrucciones sobre dónde y cómo usarlos. Las farmacias generalmente imprimen las instrucciones del medicamento sólo en las cajas y no directamente en los tubos del medicamento.  ° °Si su medicamento es muy caro, por favor, póngase en contacto con nuestra oficina llamando al [phone] y presione la opción 4 o envíenos un mensaje a través de MyChart.  ° °No podemos decirle cuál será su copago por los medicamentos por adelantado ya que esto es diferente dependiendo de la cobertura de su seguro. Sin embargo, es posible que podamos encontrar un medicamento sustituto a menor costo o llenar un formulario para que el seguro cubra el medicamento que se considera necesario.  ° °Si se requiere una autorización previa para que su compañía de seguros cubra su medicamento, por favor permítanos de 1 a 2 días hábiles para completar este proceso. ° °Los precios de los medicamentos varían con  frecuencia dependiendo del lugar de dónde se surte la receta y alguna farmacias pueden ofrecer precios más baratos. ° °El sitio web www.goodrx.com tiene cupones para medicamentos de diferentes farmacias. Los precios aquí no tienen en cuenta lo que podría costar con la ayuda del seguro (puede ser más barato con su seguro), pero el sitio web puede darle el precio si no utilizó ningún seguro.  °- Puede imprimir el cupón correspondiente y llevarlo con su receta a la farmacia.  °- También puede pasar por nuestra oficina durante el horario de atención regular y recoger una tarjeta de cupones de GoodRx.  °- Si necesita que su receta se envíe electrónicamente a una farmacia diferente, informe a nuestra oficina a través de MyChart de [facility] o por teléfono llamando al [phone] y presione la opción 4. ° °

## 2022-01-05 ENCOUNTER — Encounter: Payer: Self-pay | Admitting: Dermatology

## 2022-01-12 DIAGNOSIS — H52223 Regular astigmatism, bilateral: Secondary | ICD-10-CM | POA: Diagnosis not present

## 2022-01-12 DIAGNOSIS — H2513 Age-related nuclear cataract, bilateral: Secondary | ICD-10-CM | POA: Diagnosis not present

## 2022-01-12 DIAGNOSIS — H524 Presbyopia: Secondary | ICD-10-CM | POA: Diagnosis not present

## 2022-03-02 ENCOUNTER — Other Ambulatory Visit: Payer: Self-pay | Admitting: Family Medicine

## 2022-03-02 DIAGNOSIS — Z1231 Encounter for screening mammogram for malignant neoplasm of breast: Secondary | ICD-10-CM

## 2022-04-14 ENCOUNTER — Ambulatory Visit
Admission: RE | Admit: 2022-04-14 | Discharge: 2022-04-14 | Disposition: A | Discharge status: Home / Self Care | Payer: PPO | Source: Ambulatory Visit | Attending: Family Medicine | Admitting: Family Medicine

## 2022-04-14 DIAGNOSIS — Z1231 Encounter for screening mammogram for malignant neoplasm of breast: Secondary | ICD-10-CM | POA: Diagnosis not present

## 2022-05-25 DIAGNOSIS — E782 Mixed hyperlipidemia: Secondary | ICD-10-CM | POA: Diagnosis not present

## 2022-05-25 DIAGNOSIS — I10 Essential (primary) hypertension: Secondary | ICD-10-CM | POA: Diagnosis not present

## 2022-07-05 ENCOUNTER — Encounter: Payer: Self-pay | Admitting: Dermatology

## 2022-07-05 ENCOUNTER — Ambulatory Visit: Payer: PPO | Admitting: Dermatology

## 2022-07-05 DIAGNOSIS — L900 Lichen sclerosus et atrophicus: Secondary | ICD-10-CM | POA: Diagnosis not present

## 2022-07-05 MED ORDER — OPZELURA 1.5 % EX CREA: 1.0000 "application " | TOPICAL_CREAM | CUTANEOUS | 6 refills | Status: DC

## 2022-07-05 NOTE — Progress Notes (Signed)
   Follow-Up Visit   Subjective  Angela Dickson is a 76 y.o. female who presents for the following: Follow-up (6 month follow up Lichen Sclerosus et Atrophicus - left hip - doing well. She uses Clobetasol oint 3 times per week and Opzelura 2 times per week. Tacrolimus made area itch.).  The following portions of the chart were reviewed this encounter and updated as appropriate:   Allergies  Meds  Problems  Med Hx  Surg Hx  Fam Hx     Review of Systems:  No other skin or systemic complaints except as noted in HPI or Assessment and Plan.  Objective  Well appearing patient in no apparent distress; mood and affect are within normal limits.  A focused examination was performed including left hip. Relevant physical exam findings are noted in the Assessment and Plan.  Left Hip 12.0 x 4.5 cm pink patch   Assessment & Plan  Lichen sclerosus et atrophicus 12 x 4.5 cm today Left Hip  Chronic and persistent condition with duration or expected duration over one year. Condition is symptomatic / bothersome to patient. Not to goal.   Patient feels symptoms are improved and feels like it smaller.  Continue Clobetasol ointment qd 3-4 times per week. Increase Opzelura cream qd-bid  Ruxolitinib Phosphate (OPZELURA) 1.5 % CREA - Left Hip Apply 1 application  topically See admin instructions. Apply to affected area of left hip nightly  Related Medications clobetasol ointment (TEMOVATE) 0.05 % Apply 1 application topically See admin instructions. Use every morning at affected area of left hip on mondays,wednesdays, and fridays weekly. Avoid applying to face, groin, and axilla. Use as directed.  Return in about 6 months (around 01/05/2023).  I, Joanie Coddington, CMA, am acting as scribe for Armida Sans, MD . Documentation: I have reviewed the above documentation for accuracy and completeness, and I agree with the above.  Armida Sans, MD

## 2022-07-05 NOTE — Patient Instructions (Signed)
Due to recent changes in healthcare laws, you may see results of your pathology and/or laboratory studies on MyChart before the doctors have had a chance to review them. We understand that in some cases there may be results that are confusing or concerning to you. Please understand that not all results are received at the same time and often the doctors may need to interpret multiple results in order to provide you with the best plan of care or course of treatment. Therefore, we ask that you please give us 2 business days to thoroughly review all your results before contacting the office for clarification. Should we see a critical lab result, you will be contacted sooner.   If You Need Anything After Your Visit  If you have any questions or concerns for your doctor, please call our main line at [phone] and press option 4 to reach your doctor's medical assistant. If no one answers, please leave a voicemail as directed and we will return your call as soon as possible. Messages left after 4 pm will be answered the following business day.   You may also send us a message via MyChart. We typically respond to MyChart messages within 1-2 business days.  For prescription refills, please ask your pharmacy to contact our office. Our fax number is [phone].  If you have an urgent issue when the clinic is closed that cannot wait until the next business day, you can page your doctor at the number below.    Please note that while we do our best to be available for urgent issues outside of office hours, we are not available 24/7.   If you have an urgent issue and are unable to reach us, you may choose to seek medical care at your doctor's office, retail clinic, urgent care center, or emergency room.  If you have a medical emergency, please immediately call 911 or go to the emergency department.  Pager Numbers  - Dr. Kowalski: [phone]  - Dr. Moye: [phone]  - Dr. Stewart:  [phone]  In the event of inclement weather, please call our main line at [phone] for an update on the status of any delays or closures.  Dermatology Medication Tips: Please keep the boxes that topical medications come in in order to help keep track of the instructions about where and how to use these. Pharmacies typically print the medication instructions only on the boxes and not directly on the medication tubes.   If your medication is too expensive, please contact our office at [phone] option 4 or send us a message through MyChart.   We are unable to tell what your co-pay for medications will be in advance as this is different depending on your insurance coverage. However, we may be able to find a substitute medication at lower cost or fill out paperwork to get insurance to cover a needed medication.   If a prior authorization is required to get your medication covered by your insurance company, please allow us 1-2 business days to complete this process.  Drug prices often vary depending on where the prescription is filled and some pharmacies may offer cheaper prices.  The website www.goodrx.com contains coupons for medications through different pharmacies. The prices here do not account for what the cost may be with help from insurance (it may be cheaper with your insurance), but the website can give you the price if you did not use any insurance.  - You can print the associated coupon and take it with   your prescription to the pharmacy.  - You may also stop by our office during regular business hours and pick up a GoodRx coupon card.  - If you need your prescription sent electronically to a different pharmacy, notify our office through [facility] MyChart or by phone at [phone] option 4.     Si Usted Necesita Algo Despus de Su Visita  Tambin puede enviarnos un mensaje a travs de MyChart. Por lo general respondemos a los mensajes de MyChart en el transcurso de 1 a 2  das hbiles.  Para renovar recetas, por favor pida a su farmacia que se ponga en contacto con nuestra oficina. Nuestro nmero de fax es el [phone].  Si tiene un asunto urgente cuando la clnica est cerrada y que no puede esperar hasta el siguiente da hbil, puede llamar/localizar a su doctor(a) al nmero que aparece a continuacin.   Por favor, tenga en cuenta que aunque hacemos todo lo posible para estar disponibles para asuntos urgentes fuera del horario de oficina, no estamos disponibles las 24 horas del da, los 7 das de la semana.   Si tiene un problema urgente y no puede comunicarse con nosotros, puede optar por buscar atencin mdica  en el consultorio de su doctor(a), en una clnica privada, en un centro de atencin urgente o en una sala de emergencias.  Si tiene una emergencia mdica, por favor llame inmediatamente al 911 o vaya a la sala de emergencias.  Nmeros de bper  - Dr. Kowalski: [phone]  - Dra. Moye: [phone]  - Dra. Stewart: [phone]  En caso de inclemencias del tiempo, por favor llame a nuestra lnea principal al [phone] para una actualizacin sobre el estado de cualquier retraso o cierre.  Consejos para la medicacin en dermatologa: Por favor, guarde las cajas en las que vienen los medicamentos de uso tpico para ayudarle a seguir las instrucciones sobre dnde y cmo usarlos. Las farmacias generalmente imprimen las instrucciones del medicamento slo en las cajas y no directamente en los tubos del medicamento.   Si su medicamento es muy caro, por favor, pngase en contacto con nuestra oficina llamando al [phone] y presione la opcin 4 o envenos un mensaje a travs de MyChart.   No podemos decirle cul ser su copago por los medicamentos por adelantado ya que esto es diferente dependiendo de la cobertura de su seguro. Sin embargo, es posible que podamos encontrar un medicamento sustituto a menor costo o llenar un formulario para que el  seguro cubra el medicamento que se considera necesario.   Si se requiere una autorizacin previa para que su compaa de seguros cubra su medicamento, por favor permtanos de 1 a 2 das hbiles para completar este proceso.  Los precios de los medicamentos varan con frecuencia dependiendo del lugar de dnde se surte la receta y alguna farmacias pueden ofrecer precios ms baratos.  El sitio web www.goodrx.com tiene cupones para medicamentos de diferentes farmacias. Los precios aqu no tienen en cuenta lo que podra costar con la ayuda del seguro (puede ser ms barato con su seguro), pero el sitio web puede darle el precio si no utiliz ningn seguro.  - Puede imprimir el cupn correspondiente y llevarlo con su receta a la farmacia.  - Tambin puede pasar por nuestra oficina durante el horario de atencin regular y recoger una tarjeta de cupones de GoodRx.  - Si necesita que su receta se enve electrnicamente a una farmacia diferente, informe a nuestra oficina a travs de MyChart de [place]   o por telfono llamando al [phone] y presione la opcin 4.  

## 2022-07-16 ENCOUNTER — Encounter: Payer: Self-pay | Admitting: Dermatology

## 2022-09-27 ENCOUNTER — Ambulatory Visit
Admission: RE | Admit: 2022-09-27 | Discharge: 2022-09-27 | Disposition: A | Discharge status: Home / Self Care | Payer: PPO | Source: Ambulatory Visit | Attending: Family Medicine | Admitting: Family Medicine

## 2022-09-27 VITALS — BP 118/74 | HR 73 | Temp 98.1°F | Resp 14

## 2022-09-27 DIAGNOSIS — J329 Chronic sinusitis, unspecified: Secondary | ICD-10-CM | POA: Diagnosis not present

## 2022-09-27 DIAGNOSIS — Z1152 Encounter for screening for COVID-19: Secondary | ICD-10-CM | POA: Insufficient documentation

## 2022-09-27 LAB — RESP PANEL BY RT-PCR (RSV, FLU A&B, COVID)  RVPGX2
Influenza A by PCR: NEGATIVE
Influenza B by PCR: NEGATIVE
Resp Syncytial Virus by PCR: NEGATIVE
SARS Coronavirus 2 by RT PCR: NEGATIVE

## 2022-09-27 MED ORDER — METHYLPREDNISOLONE 4 MG PO TBPK: ORAL_TABLET | ORAL | 0 refills | Status: AC

## 2022-09-27 MED ORDER — AMOXICILLIN-POT CLAVULANATE 875-125 MG PO TABS: 1.0000 | ORAL_TABLET | Freq: Two times a day (BID) | ORAL | 0 refills | Status: AC

## 2022-09-27 NOTE — Discharge Instructions (Addendum)
You have been prescribed both an antibiotic as well as a course of steroids to treat your sinusitis.  Follow up here or with your primary care provider if your symptoms are worsening or not improving with treatment.

## 2022-09-27 NOTE — ED Triage Notes (Signed)
Pt. States she has been having sinus pressure and nasal congestion since last Tuesday.

## 2022-09-27 NOTE — ED Provider Notes (Signed)
Angela Dickson    CSN: [id number] Arrival date & time: 09/27/22  0086      History   Chief Complaint Chief Complaint  Patient presents with   Nasal Congestion    Sinus drainage,  headache since Tuesday. Was clear drainage but now cloudy looking - Entered by patient   Facial Pain    HPI Angela Dickson is a 76 y.o. female.   HPI  Patient presents with complaint of sinus pressure and nasal congestion x1 week.  She states nasal discharge was initially clear and now "cloudy looking".  She has been using Mucinex which has increased nasal discharge but not resolving any of the sinus issues.  She endorses sinus pain around her nose and eyes.  History reviewed. No pertinent past medical history.  There are no problems to display for this patient.   History reviewed. No pertinent surgical history.  OB History   No obstetric history on file.      Home Medications    Prior to Admission medications   Medication Sig Start Date End Date Taking? Authorizing Provider  amoxicillin-clavulanate (AUGMENTIN) 875-125 MG tablet Take 1 tablet by mouth every 12 (twelve) hours. 09/27/22  Yes Simmie Garin, Annie Main, FNP  methylPREDNISolone (MEDROL DOSEPAK) 4 MG TBPK tablet Steroid taper. Take as directed by packaging. 09/27/22  Yes Naydene Kamrowski, Annie Main, FNP  atorvastatin (LIPITOR) 40 MG tablet Take 40 mg by mouth daily.    [provider]  calcium carbonate (OSCAL) 1500 (600 Ca) MG TABS tablet Take 600 mg by mouth in the morning and at bedtime.    [provider]  cetirizine (ZYRTEC) 10 MG tablet Take 10 mg by mouth daily.    [provider]  clobetasol ointment (TEMOVATE) 7.61 % Apply 1 application topically See admin instructions. Use every morning at affected area of left hip on mondays,wednesdays, and fridays weekly. Avoid applying to face, groin, and axilla. Use as directed. 01/04/22   Ralene Bathe, MD  EPINEPHrine 0.3 mg/0.3 mL IJ SOAJ injection Inject into  the muscle as directed. 05/06/22   [provider]  lisinopril-hydrochlorothiazide (ZESTORETIC) 10-12.5 MG tablet Take 1 tablet by mouth daily.    [provider]  Multiple Vitamin (MULTIVITAMIN) capsule Take 1 capsule by mouth daily.    [provider]  Ruxolitinib Phosphate (OPZELURA) 1.5 % CREA Apply 1 application  topically See admin instructions. Apply to affected area of left hip nightly 07/05/22   Ralene Bathe, MD  [facility] injection  05/04/22   [provider]  [place] 5-2 LFU injection  05/08/22   [provider]    Family History Family History  Problem Relation Age of Onset   Breast cancer Other     Social History     Allergies   Acetaminophen-codeine, Bee pollen, and Fire ant   Review of Systems Review of Systems   Physical Exam Triage Vital Signs ED Triage Vitals  Enc Vitals Group     BP 09/27/22 1022 118/74     Pulse Rate 09/27/22 1022 73     Resp 09/27/22 1022 14     Temp 09/27/22 1022 98.1 F (36.7 C)     Temp src --      SpO2 09/27/22 1022 93 %     Weight --      Height --      Head Circumference --      Peak Flow --      Pain Score 09/27/22 1023 5  Pain Loc --      Pain Edu? --      Excl. in GC? --    No data found.  Updated Vital Signs BP 118/74   Pulse 73   Temp 98.1 F (36.7 C)   Resp 14   SpO2 93%   Visual Acuity Right Eye Distance:   Left Eye Distance:   Bilateral Distance:    Right Eye Near:   Left Eye Near:    Bilateral Near:     Physical Exam Vitals reviewed.  Constitutional:      Appearance: Normal appearance.  HENT:     Nose:     Right Sinus: Maxillary sinus tenderness present.     Left Sinus: Maxillary sinus tenderness present.     Mouth/Throat:     Mouth: Mucous membranes are moist.     Pharynx: No oropharyngeal exudate or posterior oropharyngeal erythema.  Eyes:     Conjunctiva/sclera: Conjunctivae normal.     Pupils: Pupils are equal, round, and reactive to  light.  Cardiovascular:     Rate and Rhythm: Normal rate and regular rhythm.  Pulmonary:     Effort: Pulmonary effort is normal.     Breath sounds: Normal breath sounds.     Comments: Breath sounds are coarse.  She denies history of asthma or COPD.  Former cigarette smoker. Skin:    General: Skin is warm and dry.  Neurological:     General: No focal deficit present.     Mental Status: She is alert and oriented to person, place, and time.  Psychiatric:        Mood and Affect: Mood normal.        Behavior: Behavior normal.      UC Treatments / Results  Labs (all labs ordered are listed, but only abnormal results are displayed) Labs Reviewed  RESP PANEL BY RT-PCR (RSV, FLU A&B, COVID)  [id number]    EKG   Radiology No results found.  Procedures Procedures (including critical care time)  Medications Ordered in UC Medications - No data to display  Initial Impression / Assessment and Plan / UC Course  I have reviewed the triage vital signs and the nursing notes.  Pertinent labs & imaging results that were available during my care of the patient were reviewed by me and considered in my medical decision making (see chart for details).   Acute rhinosinusitis, allergic versus bacterial.  Possibly secondary infection developing.  Respiratory swab is obtained and pending.  Will treat for bacterial with Augmentin twice daily x7 days along with steroid taper.   Final Clinical Impressions(s) / UC Diagnoses   Final diagnoses:  Rhinosinusitis     Discharge Instructions      You have been prescribed both an antibiotic as well as a course of steroids to treat your sinusitis.  Follow up here or with your primary care provider if your symptoms are worsening or not improving with treatment.    ED Prescriptions     Medication Sig Dispense Auth. Provider   amoxicillin-clavulanate (AUGMENTIN) 875-125 MG tablet Take 1 tablet by mouth every 12 (twelve) hours. 14 tablet Pax Reasoner,  Cassie Henkels, FNP   methylPREDNISolone (MEDROL DOSEPAK) 4 MG TBPK tablet Steroid taper. Take as directed by packaging. 21 tablet Savvas Roper, FNP      PDMP not reviewed this encounter.   Charma Igo, [place] 09/27/22 1053

## 2022-11-25 DIAGNOSIS — E669 Obesity, unspecified: Secondary | ICD-10-CM | POA: Diagnosis not present

## 2022-11-25 DIAGNOSIS — Z0000 Encounter for general adult medical examination without abnormal findings: Secondary | ICD-10-CM | POA: Diagnosis not present

## 2022-11-25 DIAGNOSIS — I10 Essential (primary) hypertension: Secondary | ICD-10-CM | POA: Diagnosis not present

## 2022-11-25 DIAGNOSIS — J309 Allergic rhinitis, unspecified: Secondary | ICD-10-CM | POA: Diagnosis not present

## 2022-11-25 DIAGNOSIS — E782 Mixed hyperlipidemia: Secondary | ICD-10-CM | POA: Diagnosis not present

## 2022-11-25 DIAGNOSIS — Z1331 Encounter for screening for depression: Secondary | ICD-10-CM | POA: Diagnosis not present

## 2023-01-05 ENCOUNTER — Ambulatory Visit: Payer: PPO | Admitting: Dermatology

## 2023-01-05 DIAGNOSIS — Z79899 Other long term (current) drug therapy: Secondary | ICD-10-CM | POA: Diagnosis not present

## 2023-01-05 DIAGNOSIS — L900 Lichen sclerosus et atrophicus: Secondary | ICD-10-CM | POA: Diagnosis not present

## 2023-01-05 MED ORDER — OPZELURA 1.5 % EX CREA: 1.0000 "application " | TOPICAL_CREAM | CUTANEOUS | 11 refills | Status: DC

## 2023-01-05 MED ORDER — CLOBETASOL PROPIONATE 0.05 % EX OINT: 1.0000 | TOPICAL_OINTMENT | CUTANEOUS | 6 refills | Status: DC

## 2023-01-05 NOTE — Patient Instructions (Signed)
Due to recent changes in healthcare laws, you may see results of your pathology and/or laboratory studies on MyChart before the doctors have had a chance to review them. We understand that in some cases there may be results that are confusing or concerning to you. Please understand that not all results are received at the same time and often the doctors may need to interpret multiple results in order to provide you with the best plan of care or course of treatment. Therefore, we ask that you please give us 2 business days to thoroughly review all your results before contacting the office for clarification. Should we see a critical lab result, you will be contacted sooner.   If You Need Anything After Your Visit  If you have any questions or concerns for your doctor, please call our main line at [phone] and press option 4 to reach your doctor's medical assistant. If no one answers, please leave a voicemail as directed and we will return your call as soon as possible. Messages left after 4 pm will be answered the following business day.   You may also send us a message via MyChart. We typically respond to MyChart messages within 1-2 business days.  For prescription refills, please ask your pharmacy to contact our office. Our fax number is [phone].  If you have an urgent issue when the clinic is closed that cannot wait until the next business day, you can page your doctor at the number below.    Please note that while we do our best to be available for urgent issues outside of office hours, we are not available 24/7.   If you have an urgent issue and are unable to reach us, you may choose to seek medical care at your doctor's office, retail clinic, urgent care center, or emergency room.  If you have a medical emergency, please immediately call 911 or go to the emergency department.  Pager Numbers  - Dr. Kowalski: [phone]  - Dr. Moye: [phone]  - Dr. Stewart:  [phone]  In the event of inclement weather, please call our main line at [phone] for an update on the status of any delays or closures.  Dermatology Medication Tips: Please keep the boxes that topical medications come in in order to help keep track of the instructions about where and how to use these. Pharmacies typically print the medication instructions only on the boxes and not directly on the medication tubes.   If your medication is too expensive, please contact our office at [phone] option 4 or send us a message through MyChart.   We are unable to tell what your co-pay for medications will be in advance as this is different depending on your insurance coverage. However, we may be able to find a substitute medication at lower cost or fill out paperwork to get insurance to cover a needed medication.   If a prior authorization is required to get your medication covered by your insurance company, please allow us 1-2 business days to complete this process.  Drug prices often vary depending on where the prescription is filled and some pharmacies may offer cheaper prices.  The website www.goodrx.com contains coupons for medications through different pharmacies. The prices here do not account for what the cost may be with help from insurance (it may be cheaper with your insurance), but the website can give you the price if you did not use any insurance.  - You can print the associated coupon and take it with   your prescription to the pharmacy.  - You may also stop by our office during regular business hours and pick up a GoodRx coupon card.  - If you need your prescription sent electronically to a different pharmacy, notify our office through [facility] MyChart or by phone at [phone] option 4.     Si Usted Necesita Algo Despus de Su Visita  Tambin puede enviarnos un mensaje a travs de MyChart. Por lo general respondemos a los mensajes de MyChart en el transcurso de 1 a 2  das hbiles.  Para renovar recetas, por favor pida a su farmacia que se ponga en contacto con nuestra oficina. Nuestro nmero de fax es el [phone].  Si tiene un asunto urgente cuando la clnica est cerrada y que no puede esperar hasta el siguiente da hbil, puede llamar/localizar a su doctor(a) al nmero que aparece a continuacin.   Por favor, tenga en cuenta que aunque hacemos todo lo posible para estar disponibles para asuntos urgentes fuera del horario de oficina, no estamos disponibles las 24 horas del da, los 7 das de la semana.   Si tiene un problema urgente y no puede comunicarse con nosotros, puede optar por buscar atencin mdica  en el consultorio de su doctor(a), en una clnica privada, en un centro de atencin urgente o en una sala de emergencias.  Si tiene una emergencia mdica, por favor llame inmediatamente al 911 o vaya a la sala de emergencias.  Nmeros de bper  - Dr. Kowalski: [phone]  - Dra. Moye: [phone]  - Dra. Stewart: [phone]  En caso de inclemencias del tiempo, por favor llame a nuestra lnea principal al [phone] para una actualizacin sobre el estado de cualquier retraso o cierre.  Consejos para la medicacin en dermatologa: Por favor, guarde las cajas en las que vienen los medicamentos de uso tpico para ayudarle a seguir las instrucciones sobre dnde y cmo usarlos. Las farmacias generalmente imprimen las instrucciones del medicamento slo en las cajas y no directamente en los tubos del medicamento.   Si su medicamento es muy caro, por favor, pngase en contacto con nuestra oficina llamando al [phone] y presione la opcin 4 o envenos un mensaje a travs de MyChart.   No podemos decirle cul ser su copago por los medicamentos por adelantado ya que esto es diferente dependiendo de la cobertura de su seguro. Sin embargo, es posible que podamos encontrar un medicamento sustituto a menor costo o llenar un formulario para que el  seguro cubra el medicamento que se considera necesario.   Si se requiere una autorizacin previa para que su compaa de seguros cubra su medicamento, por favor permtanos de 1 a 2 das hbiles para completar este proceso.  Los precios de los medicamentos varan con frecuencia dependiendo del lugar de dnde se surte la receta y alguna farmacias pueden ofrecer precios ms baratos.  El sitio web www.goodrx.com tiene cupones para medicamentos de diferentes farmacias. Los precios aqu no tienen en cuenta lo que podra costar con la ayuda del seguro (puede ser ms barato con su seguro), pero el sitio web puede darle el precio si no utiliz ningn seguro.  - Puede imprimir el cupn correspondiente y llevarlo con su receta a la farmacia.  - Tambin puede pasar por nuestra oficina durante el horario de atencin regular y recoger una tarjeta de cupones de GoodRx.  - Si necesita que su receta se enve electrnicamente a una farmacia diferente, informe a nuestra oficina a travs de MyChart de [place]   o por telfono llamando al [phone] y presione la opcin 4.  

## 2023-01-05 NOTE — Progress Notes (Signed)
   Follow-Up Visit   Subjective  Angela Dickson is a 77 y.o. female who presents for the following: Follow-up (6 months f/u on the left hip Lichen sclerosus treating with Opzelura and Clobetasol ointment with a good response ).  The following portions of the chart were reviewed this encounter and updated as appropriate:   Allergies  Meds  Problems  Med Hx  Surg Hx  Fam Hx     Review of Systems:  No other skin or systemic complaints except as noted in HPI or Assessment and Plan.  Objective  Well appearing patient in no apparent distress; mood and affect are within normal limits.  A focused examination was performed including left hip. Relevant physical exam findings are noted in the Assessment and Plan.  left hip 11 x 4 pink patch, improving       Assessment & Plan  Lichen sclerosus et atrophicus left hip  Improving compared to prior visit  Chronic and persistent condition with duration or expected duration over one year. Condition is symptomatic / bothersome to patient. Not to goal.   Patient feels symptoms are improved and feels like it smaller.   Continue Clobetasol ointment qd 3-4 times per week. Continue  Opzelura cream qd-bid  Related Medications Ruxolitinib Phosphate (OPZELURA) 1.5 % CREA Apply 1 application  topically See admin instructions. Apply to affected area of left hip nightly  clobetasol ointment (TEMOVATE) 3.61 % Apply 1 Application topically See admin instructions. Use every morning at affected area of left hip on mondays,wednesdays, and fridays weekly. Avoid applying to face, groin, and axilla. Use as directed.   Return in about 1 year (around 4/43/1540) for Lichen sclerosus .  IMarye Round, CMA, am acting as scribe for Sarina Ser, MD .  Documentation: I have reviewed the above documentation for accuracy and completeness, and I agree with the above.  Sarina Ser, MD

## 2023-01-18 ENCOUNTER — Encounter: Payer: Self-pay | Admitting: Dermatology

## 2023-03-16 ENCOUNTER — Other Ambulatory Visit: Payer: Self-pay | Admitting: Family Medicine

## 2023-03-16 DIAGNOSIS — Z1231 Encounter for screening mammogram for malignant neoplasm of breast: Secondary | ICD-10-CM

## 2023-04-16 ENCOUNTER — Ambulatory Visit: Payer: PPO

## 2023-04-20 ENCOUNTER — Ambulatory Visit
Admission: RE | Admit: 2023-04-20 | Discharge: 2023-04-20 | Disposition: A | Discharge status: Home / Self Care | Payer: PPO | Source: Ambulatory Visit | Attending: Family Medicine | Admitting: Family Medicine

## 2023-04-20 DIAGNOSIS — Z1231 Encounter for screening mammogram for malignant neoplasm of breast: Secondary | ICD-10-CM

## 2023-06-01 DIAGNOSIS — E669 Obesity, unspecified: Secondary | ICD-10-CM | POA: Diagnosis not present

## 2023-06-01 DIAGNOSIS — J309 Allergic rhinitis, unspecified: Secondary | ICD-10-CM | POA: Diagnosis not present

## 2023-06-01 DIAGNOSIS — F17200 Nicotine dependence, unspecified, uncomplicated: Secondary | ICD-10-CM | POA: Diagnosis not present

## 2023-06-01 DIAGNOSIS — E782 Mixed hyperlipidemia: Secondary | ICD-10-CM | POA: Diagnosis not present

## 2023-06-01 DIAGNOSIS — I10 Essential (primary) hypertension: Secondary | ICD-10-CM | POA: Diagnosis not present

## 2023-12-07 DIAGNOSIS — I10 Essential (primary) hypertension: Secondary | ICD-10-CM | POA: Diagnosis not present

## 2023-12-07 DIAGNOSIS — G43109 Migraine with aura, not intractable, without status migrainosus: Secondary | ICD-10-CM | POA: Diagnosis not present

## 2023-12-07 DIAGNOSIS — E669 Obesity, unspecified: Secondary | ICD-10-CM | POA: Diagnosis not present

## 2023-12-07 DIAGNOSIS — Z0000 Encounter for general adult medical examination without abnormal findings: Secondary | ICD-10-CM | POA: Diagnosis not present

## 2023-12-07 DIAGNOSIS — E782 Mixed hyperlipidemia: Secondary | ICD-10-CM | POA: Diagnosis not present

## 2023-12-07 DIAGNOSIS — Z1331 Encounter for screening for depression: Secondary | ICD-10-CM | POA: Diagnosis not present

## 2023-12-07 DIAGNOSIS — Z23 Encounter for immunization: Secondary | ICD-10-CM | POA: Diagnosis not present

## 2024-01-12 ENCOUNTER — Ambulatory Visit: Payer: PPO | Admitting: Dermatology

## 2024-01-19 ENCOUNTER — Ambulatory Visit (INDEPENDENT_AMBULATORY_CARE_PROVIDER_SITE_OTHER): Payer: PPO | Admitting: Dermatology

## 2024-01-19 DIAGNOSIS — L900 Lichen sclerosus et atrophicus: Secondary | ICD-10-CM | POA: Diagnosis not present

## 2024-01-19 MED ORDER — CLOBETASOL PROPIONATE 0.05 % EX OINT: 1.0000 | TOPICAL_OINTMENT | CUTANEOUS | 6 refills | Status: AC

## 2024-01-19 MED ORDER — OPZELURA 1.5 % EX CREA: 1.0000 "application " | TOPICAL_CREAM | CUTANEOUS | 11 refills | Status: AC

## 2024-01-19 NOTE — Patient Instructions (Signed)
Continue clobetasol 0.05% ointment 3 days weekly (M/W/F) as needed flor flares to left hip. Avoid applying to face, groin, and axilla. Use as directed. Long-term use can cause thinning of the skin.  Continue Opzelura 1-2 times daily to affected areas.  Topical steroids (such as triamcinolone, fluocinolone, fluocinonide, mometasone, clobetasol, halobetasol, betamethasone, hydrocortisone) can cause thinning and lightening of the skin if they are used for too long in the same area. Your physician has selected the right strength medicine for your problem and area affected on the body. Please use your medication only as directed by your physician to prevent side effects.   Due to recent changes in healthcare laws, you may see results of your pathology and/or laboratory studies on MyChart before the doctors have had a chance to review them. We understand that in some cases there may be results that are confusing or concerning to you. Please understand that not all results are received at the same time and often the doctors may need to interpret multiple results in order to provide you with the best plan of care or course of treatment. Therefore, we ask that you please give [place] 2 business days to thoroughly review all your results before contacting the office for clarification. Should we see a critical lab result, you will be contacted sooner.   If You Need Anything After Your Visit  If you have any questions or concerns for your doctor, please call our main line at [phone] and press option 4 to reach your doctor's medical assistant. If no one answers, please leave a voicemail as directed and we will return your call as soon as possible. Messages left after 4 pm will be answered the following business day.   You may also send [place] a message via MyChart. We typically respond to MyChart messages within 1-2 business days.  For prescription refills, please ask your pharmacy to contact our office. Our fax  number is [phone].  If you have an urgent issue when the clinic is closed that cannot wait until the next business day, you can page your doctor at the number below.    Please note that while we do our best to be available for urgent issues outside of office hours, we are not available 24/7.   If you have an urgent issue and are unable to reach [place], you may choose to seek medical care at your doctor's office, retail clinic, urgent care center, or emergency room.  If you have a medical emergency, please immediately call 911 or go to the emergency department.  Pager Numbers  - Dr. Gwen Pounds: [phone]  - Dr. Roseanne Reno: [phone]  - Dr. Katrinka Blazing: [phone]   In the event of inclement weather, please call our main line at [phone] for an update on the status of any delays or closures.  Dermatology Medication Tips: Please keep the boxes that topical medications come in in order to help keep track of the instructions about where and how to use these. Pharmacies typically print the medication instructions only on the boxes and not directly on the medication tubes.   If your medication is too expensive, please contact our office at [phone] option 4 or send [place] a message through MyChart.   We are unable to tell what your co-pay for medications will be in advance as this is different depending on your insurance coverage. However, we may be able to find a substitute medication at lower cost or fill out paperwork to get insurance to cover a needed  medication.   If a prior authorization is required to get your medication covered by your insurance company, please allow [place] 1-2 business days to complete this process.  Drug prices often vary depending on where the prescription is filled and some pharmacies may offer cheaper prices.  The website www.goodrx.com contains coupons for medications through different pharmacies. The prices here do not account for what the cost may be with help  from insurance (it may be cheaper with your insurance), but the website can give you the price if you did not use any insurance.  - You can print the associated coupon and take it with your prescription to the pharmacy.  - You may also stop by our office during regular business hours and pick up a GoodRx coupon card.  - If you need your prescription sent electronically to a different pharmacy, notify our office through [facility] or by phone at [phone] option 4.     Si Usted Necesita Algo Despus de Su Visita  Tambin puede enviarnos un mensaje a travs de Clinical cytogeneticist. Por lo general respondemos a los mensajes de MyChart en el transcurso de 1 a 2 das hbiles.  Para renovar recetas, por favor pida a su farmacia que se ponga en contacto con nuestra oficina. Annie Sable de fax es [place] [phone].  Si tiene un asunto urgente cuando la clnica est cerrada y que no puede esperar hasta el siguiente da hbil, puede llamar/localizar a su doctor(a) al nmero que aparece a continuacin.   Por favor, tenga en cuenta que aunque hacemos todo lo posible para estar disponibles para asuntos urgentes fuera del horario de [place], no estamos disponibles las 24 horas del da, los 7 [address]  [address] de la [place].   Si tiene un problema urgente y no puede comunicarse con nosotros, puede optar por buscar atencin mdica  en el consultorio de su doctor(a), en una clnica privada, en un centro de atencin urgente o en una sala de emergencias.  Si tiene Engineer, drilling, por favor llame inmediatamente al 911 o vaya a la sala de emergencias.  Nmeros de bper  - Dr. Gwen Pounds: [phone]  - Dra. Roseanne Reno: [phone]  - Dr. Katrinka Blazing: [phone]   En caso de inclemencias del tiempo, por favor llame a Lacy Duverney principal al [phone] para una actualizacin sobre el [place] de cualquier retraso o cierre.  Consejos para la medicacin en dermatologa: Por favor, guarde las cajas en las que vienen los  medicamentos de uso tpico para ayudarle a seguir las instrucciones sobre dnde y cmo usarlos. Las farmacias generalmente imprimen las instrucciones del medicamento slo en las cajas y no directamente en los tubos del [place].   Si su medicamento es muy caro, por favor, pngase en contacto con Rolm Gala llamando al [phone] y presione la opcin 4 o envenos un mensaje a travs de Clinical cytogeneticist.   No podemos decirle cul ser su copago por los medicamentos por adelantado ya que esto es diferente dependiendo de la cobertura de su seguro. Sin embargo, es posible que podamos encontrar un medicamento sustituto a Audiological scientist un formulario para que el seguro cubra el medicamento que se considera necesario.   Si se requiere una autorizacin previa para que su compaa de seguros [place] su medicamento, por favor permtanos de 1 a 2 das hbiles para completar [address]  Los precios de los medicamentos varan con frecuencia dependiendo del Environmental consultant de dnde se surte la receta y alguna farmacias pueden ofrecer precios ms baratos.  El sitio web www.goodrx.com tiene cupones para medicamentos de Health and safety inspector. Los precios aqu no tienen en cuenta lo que podra costar con la ayuda del seguro (puede ser ms barato con su seguro), pero el sitio web puede darle el precio si no utiliz Tourist information centre manager.  - Puede imprimir el cupn correspondiente y llevarlo con su receta a la farmacia.  - Tambin puede pasar por nuestra oficina durante el horario de atencin regular y Education officer, museum una tarjeta de cupones de GoodRx.  - Si necesita que su receta se enve electrnicamente a una farmacia diferente, informe a nuestra oficina a travs de MyChart de [place] o por telfono llamando al [phone] y presione la opcin 4.

## 2024-01-19 NOTE — Progress Notes (Signed)
   Follow-Up Visit   Subjective  Angela Dickson is a 78 y.o. 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 Patient using clobetasol ointment and Opzelura, each about 3 days a week. Patient advises itching is much improved. Not spreading.  Also no vaginal symptoms.  The patient has spots, moles and lesions to be evaluated, some may be new or changing and the patient may have concern these could be cancer.   The following portions of the chart were reviewed this encounter and updated as appropriate: medications, allergies, medical history  Review of Systems:  No other skin or systemic complaints except as noted in HPI or Assessment and Plan.  Objective  Well appearing patient in no apparent distress; mood and affect are within normal limits.    A focused examination was performed of the following areas: Left hip  Relevant exam findings are noted in the Assessment and Plan.  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 Condition is improving with treatment but not currently at goal.   Lichen sclerosus is a chronic inflammatory condition of unknown cause that frequently involves the vaginal area and less commonly extragenital skin, and is NOT sexually transmitted. It frequently causes symptoms of pain and burning.  It requires regular monitoring and treatment with topical steroids to minimize inflammation and to reduce risk of scarring. There is also a risk of cancer in the vaginal area which is very low if inflammation is well controlled. Regular checks of the area are recommended. Please call if you notice any new or changing spots within this area.   Biopsy proven Biopsy was done in [place] with previous dermatologist.   Treatment Plan:  Continue clobetasol  0.05% ointment 3 days weekly (M/W/F) as needed flor flares to left hip. Avoid applying to face, groin, and axilla. Use as directed. Long-term use can cause thinning of the skin. Continue Opzelura 1-2 times daily to affected areas.  Topical steroids (such as triamcinolone, fluocinolone, fluocinonide, mometasone, clobetasol, halobetasol, betamethasone, hydrocortisone) can cause thinning and lightening of the skin if they are used for too long in the same area. Your physician has selected the right strength medicine for your problem and area affected on the body. Please use your medication only as directed by your physician to prevent side effects.   Ruxolitinib Phosphate (OPZELURA) 1.5 % CREA - Left Hip Apply 1 application  topically See admin instructions. Apply to affected area of left hip nightly  clobetasol ointment (TEMOVATE) 0.05 % - Left Hip Apply 1 Application topically See admin instructions. Use every morning at affected area of left hip on mondays,wednesdays, and fridays weekly. Avoid applying to face, groin, and axilla. Use as directed.  Return in about 1 year (around 01/18/2025) for LS et A with Dr. Kirtland Bouchard or Dr. Roseanne Reno.  Anise Salvo, RMA, am acting as scribe for Willeen Niece, MD .   Documentation: I have reviewed the above documentation for accuracy and completeness, and I agree with the above.  Willeen Niece, MD

## 2024-03-06 ENCOUNTER — Other Ambulatory Visit: Payer: Self-pay | Admitting: Family Medicine

## 2024-03-06 DIAGNOSIS — Z1231 Encounter for screening mammogram for malignant neoplasm of breast: Secondary | ICD-10-CM

## 2024-04-20 ENCOUNTER — Ambulatory Visit
Admission: RE | Admit: 2024-04-20 | Discharge: 2024-04-20 | Disposition: A | Discharge status: Home / Self Care | Source: Ambulatory Visit | Attending: Family Medicine | Admitting: Family Medicine

## 2024-04-20 DIAGNOSIS — Z1231 Encounter for screening mammogram for malignant neoplasm of breast: Secondary | ICD-10-CM | POA: Diagnosis not present

## 2024-06-07 DIAGNOSIS — I10 Essential (primary) hypertension: Secondary | ICD-10-CM | POA: Diagnosis not present

## 2024-06-07 DIAGNOSIS — J309 Allergic rhinitis, unspecified: Secondary | ICD-10-CM | POA: Diagnosis not present

## 2024-06-07 DIAGNOSIS — E669 Obesity, unspecified: Secondary | ICD-10-CM | POA: Diagnosis not present

## 2024-06-07 DIAGNOSIS — E782 Mixed hyperlipidemia: Secondary | ICD-10-CM | POA: Diagnosis not present

## 2025-01-21 ENCOUNTER — Ambulatory Visit: Admitting: Dermatology

## 2025-01-23 ENCOUNTER — Ambulatory Visit: Admitting: Dermatology

## 2025-01-23 ENCOUNTER — Ambulatory Visit: Payer: PPO | Admitting: Dermatology

## 2025-02-12 ENCOUNTER — Ambulatory Visit: Admitting: Dermatology
